# Patient Record
Sex: Male | Born: 1995 | Race: White | Hispanic: No | Marital: Single | Smoking: Never smoker
Health system: Southern US, Community
[De-identification: ages and names within clinical notes are randomized; demographics above are authoritative.]

## PROBLEM LIST (undated history)

## (undated) HISTORY — PX: MOUTH SURGERY: SHX715

---

## 2015-12-30 ENCOUNTER — Encounter: Payer: Self-pay | Admitting: Emergency Medicine

## 2015-12-30 ENCOUNTER — Emergency Department: Payer: BLUE CROSS/BLUE SHIELD

## 2015-12-30 ENCOUNTER — Emergency Department
Admission: EM | Admit: 2015-12-30 | Discharge: 2015-12-30 | Disposition: A | Discharge status: Home / Self Care | Payer: BLUE CROSS/BLUE SHIELD | Attending: Emergency Medicine | Admitting: Emergency Medicine

## 2015-12-30 DIAGNOSIS — Y929 Unspecified place or not applicable: Secondary | ICD-10-CM | POA: Insufficient documentation

## 2015-12-30 DIAGNOSIS — X500XXA Overexertion from strenuous movement or load, initial encounter: Secondary | ICD-10-CM | POA: Diagnosis not present

## 2015-12-30 DIAGNOSIS — S86811A Strain of other muscle(s) and tendon(s) at lower leg level, right leg, initial encounter: Secondary | ICD-10-CM | POA: Diagnosis not present

## 2015-12-30 DIAGNOSIS — Y998 Other external cause status: Secondary | ICD-10-CM | POA: Insufficient documentation

## 2015-12-30 DIAGNOSIS — Y9365 Activity, lacrosse and field hockey: Secondary | ICD-10-CM | POA: Diagnosis not present

## 2015-12-30 DIAGNOSIS — M25561 Pain in right knee: Secondary | ICD-10-CM

## 2015-12-30 DIAGNOSIS — S86911A Strain of unspecified muscle(s) and tendon(s) at lower leg level, right leg, initial encounter: Secondary | ICD-10-CM

## 2015-12-30 DIAGNOSIS — S8991XA Unspecified injury of right lower leg, initial encounter: Secondary | ICD-10-CM | POA: Diagnosis present

## 2015-12-30 MED ORDER — KETOROLAC TROMETHAMINE 60 MG/2ML IM SOLN
60.0000 mg | Freq: Once | INTRAMUSCULAR | Status: AC
  Administered 2015-12-30: 60 mg via INTRAMUSCULAR
  Filled 2015-12-30: qty 2

## 2015-12-30 MED ORDER — TRAMADOL HCL 50 MG PO TABS: 50.0000 mg | ORAL_TABLET | Freq: Four times a day (QID) | ORAL | 0 refills | Status: DC | PRN

## 2015-12-30 MED ORDER — TRAMADOL HCL 50 MG PO TABS
50.0000 mg | ORAL_TABLET | Freq: Once | ORAL | Status: AC
  Administered 2015-12-30: 50 mg via ORAL
  Filled 2015-12-30: qty 1

## 2015-12-30 NOTE — ED Provider Notes (Signed)
[facility] Emergency Department Provider Note   ____________________________________________   First MD Initiated Contact with Patient 12/30/15 [phone]     (approximate)  I have reviewed the triage vital signs and the nursing notes.   HISTORY  Chief Complaint Knee Injury    HPI Luther HearingBrendan P M Weber is a 20 y.o. male who comes into the hospital today with some right-sided medial knee pain. The patient was playing Lacrosse this evening and fell. He reports that his knee was pushed up. He felt as though his patella was dislocated and when he tried to reduce the dislocation someone pushed him again and his knee extended. He reports that he felt a pop in his medial knee and developed pain. The patient is unable to walk on it. He tried to go home but it was very painful. At rest he doesn't have any pain but if he tries to move or straighten his leg is very painful. His knee is most comfortable when he flexed. He did not take any medication at home for the pain but reports that he did ice it. He has had some dislocations in the past. He is here for evaluation   History reviewed. No pertinent past medical history.  There are no active problems to display for this patient.   Past Surgical History:  Procedure Laterality Date  . MOUTH SURGERY      Prior to Admission medications   Medication Sig Start Date End Date Taking? Authorizing Provider  traMADol (ULTRAM) 50 MG tablet Take 1 tablet (50 mg total) by mouth every 6 (six) hours as needed. 12/30/15   Rebecka ApleyAllison P Aliscia Clayton, MD    Allergies Patient has no known allergies.  No family history on file.  Social History Social History  Substance Use Topics  . Smoking status: Never Smoker  . Smokeless tobacco: Never Used  . Alcohol use Yes    Review of Systems Constitutional: No fever/chills Eyes: No visual changes. ENT: No sore throat. Cardiovascular: Denies chest pain. Respiratory: Denies shortness of  breath. Gastrointestinal: No abdominal pain.  No nausea, no vomiting.  No diarrhea.  No constipation. Genitourinary: Negative for dysuria. Musculoskeletal: Right knee pain Skin: Negative for rash. Neurological: Negative for headaches, focal weakness or numbness.  10-point ROS otherwise negative.  ____________________________________________   PHYSICAL EXAM:  VITAL SIGNS: ED Triage Vitals  Enc Vitals Group     BP 12/30/15 0059 135/78     Pulse Rate 12/30/15 0059 69     Resp 12/30/15 0059 18     Temp 12/30/15 0059 98.2 F (36.8 C)     Temp Source 12/30/15 0059 Oral     SpO2 12/30/15 0059 97 %     Weight 12/30/15 0100 160 lb (72.6 kg)     Height 12/30/15 0100 6' (1.829 m)     Head Circumference --      Peak Flow --      Pain Score 12/30/15 0100 6     Pain Loc --      Pain Edu? --      Excl. in GC? --     Constitutional: Alert and oriented. Well appearing and in mild distress. Eyes: Conjunctivae are normal. PERRL. EOMI. Head: Atraumatic. Nose: No congestion/rhinnorhea. Mouth/Throat: Mucous membranes are moist.  Oropharynx non-erythematous. Cardiovascular: Normal rate, regular rhythm. Grossly normal heart sounds.  Good peripheral circulation. Respiratory: Normal respiratory effort.  No retractions. Lungs CTAB. Gastrointestinal: Soft and nontender. No distention. Positive bowel sounds Musculoskeletal: Right knee pain  to palpation medially, no effusion. Pain to varus stress, pain with extension of leg, negative anterior and posterior drawer sign.  Neurologic:  Normal speech and language.  Skin:  Skin is warm, dry and intact.  Psychiatric: Mood and affect are normal.   ____________________________________________   LABS (all labs ordered are listed, but only abnormal results are displayed)  Labs Reviewed - No data to display ____________________________________________  EKG  none ____________________________________________  RADIOLOGY  Right knee  xray ____________________________________________   PROCEDURES  Procedure(s) performed: None  Procedures  Critical Care performed: No  ____________________________________________   INITIAL IMPRESSION / ASSESSMENT AND PLAN / ED COURSE  Pertinent labs & imaging results that were available during my care of the patient were reviewed by me and considered in my medical decision making (see chart for details).  This is a 20 year old male who comes into the hospital today with some knee pain after an injury while playing Lacrosse. I did attempt to straighten the patient's leg and it is uncomfortable. I'll give the patient a shot of Toradol and a dose of tramadol for his pain. I will send the patient for an x-ray and place him in a knee immobilizer. The patient will also receive some crutches.  Clinical Course as of Dec 30 446  Tue Dec 30, 2015  0300 Negative DG Knee Complete 4 Views Right [AW]    Clinical Course User Index [AW] Rebecka ApleyAllison P Sanaa Zilberman, MD   The patient's x-ray is negative. The patient needs to follow-up with orthopedic surgery for further evaluation of his knee pain. He will receive some medication for home and he'll be discharged to follow-up.  ____________________________________________   FINAL CLINICAL IMPRESSION(S) / ED DIAGNOSES  Final diagnoses:  Acute pain of right knee  Strain of right knee, initial encounter      NEW MEDICATIONS STARTED DURING THIS VISIT:  New Prescriptions   TRAMADOL (ULTRAM) 50 MG TABLET    Take 1 tablet (50 mg total) by mouth every 6 (six) hours as needed.     Note:  This document was prepared using Dragon voice recognition software and may include unintentional dictation errors.    Rebecka ApleyAllison P Casara Perrier, MD 12/30/15 [phone]

## 2015-12-30 NOTE — ED Triage Notes (Signed)
Pt presents to ED with c/o right knee injury , pt reports feeling right knee a tear and hearing a pop after playing lacrosse last night around 9 pm. No swelling or obvious deformity noted.

## 2018-04-23 ENCOUNTER — Encounter (HOSPITAL_COMMUNITY): Payer: Self-pay | Admitting: Emergency Medicine

## 2018-04-23 ENCOUNTER — Emergency Department (HOSPITAL_COMMUNITY): Payer: Self-pay

## 2018-04-23 ENCOUNTER — Emergency Department (HOSPITAL_COMMUNITY)
Admission: EM | Admit: 2018-04-23 | Discharge: 2018-04-23 | Disposition: A | Discharge status: Home / Self Care | Payer: Self-pay | Attending: Emergency Medicine | Admitting: Emergency Medicine

## 2018-04-23 ENCOUNTER — Other Ambulatory Visit: Payer: Self-pay

## 2018-04-23 DIAGNOSIS — Y929 Unspecified place or not applicable: Secondary | ICD-10-CM | POA: Insufficient documentation

## 2018-04-23 DIAGNOSIS — X500XXA Overexertion from strenuous movement or load, initial encounter: Secondary | ICD-10-CM | POA: Insufficient documentation

## 2018-04-23 DIAGNOSIS — S82455A Nondisplaced comminuted fracture of shaft of left fibula, initial encounter for closed fracture: Secondary | ICD-10-CM

## 2018-04-23 DIAGNOSIS — Y9389 Activity, other specified: Secondary | ICD-10-CM | POA: Insufficient documentation

## 2018-04-23 DIAGNOSIS — Y999 Unspecified external cause status: Secondary | ICD-10-CM | POA: Insufficient documentation

## 2018-04-23 MED ORDER — HYDROCODONE-ACETAMINOPHEN 5-325 MG PO TABS: 1.0000 | ORAL_TABLET | ORAL | 0 refills | Status: AC | PRN

## 2018-04-23 NOTE — Progress Notes (Signed)
Orthopedic Tech Progress Note Patient Details:  Darren Weber 21-Jan-1996 [id number] Wanted to give crutch training but he said he had crutches before. Ortho Devices Type of Ortho Device: Crutches, Short leg splint Ortho Device/Splint Location: LLE Ortho Device/Splint Interventions: Application, Adjustment, Ordered   Post Interventions Patient Tolerated: Well Instructions Provided: Care of device, Adjustment of device   Darren Weber 04/23/2018, 3:33 PM

## 2018-04-23 NOTE — ED Provider Notes (Signed)
[facility] EMERGENCY DEPARTMENT Provider Note   CSN: [id number] Arrival date & time: 04/23/18  1409    History   Chief Complaint Chief Complaint  Patient presents with  . Ankle Injury    HPI Darren Weber is a 23 y.o. male.     23 year old male presents with complaint of left ankle injury.  Patient states that he was doing a back flip last night when his ankle gave out and rolled.  She reports pain and swelling to diffuse left ankle.  Did not hit head, no loss of consciousness, no other injuries or concerns.  Reports prior sprain and fracture to this ankle several years ago, did not require surgical repair. Also ecchymosis to left great toe from injury skiing a few weeks ago.     History reviewed. No pertinent past medical history.  There are no active problems to display for this patient.   Past Surgical History:  Procedure Laterality Date  . MOUTH SURGERY          Home Medications    Prior to Admission medications   Medication Sig Start Date End Date Taking? Authorizing Provider  HYDROcodone-acetaminophen (NORCO/VICODIN) 5-325 MG tablet Take 1 tablet by mouth every 4 (four) hours as needed. 04/23/18   Jeannie Fend, PA-C    Family History No family history on file.  Social History Social History   Tobacco Use  . Smoking status: Never Smoker  . Smokeless tobacco: Never Used  Substance Use Topics  . Alcohol use: Yes  . Drug use: Not on file     Allergies   Patient has no known allergies.   Review of Systems Review of Systems  Constitutional: Negative for fever.  Musculoskeletal: Positive for arthralgias, joint swelling and myalgias.  Skin: Negative for color change, rash and wound.  Allergic/Immunologic: Negative for immunocompromised state.  Neurological: Negative for weakness and numbness.  Hematological: Does not bruise/bleed easily.  Psychiatric/Behavioral: Negative for self-injury.  All other systems reviewed and are  negative.    Physical Exam Updated Vital Signs BP (!) 154/74 (BP Location: Left Arm)   Pulse 98   Temp 98.2 F (36.8 C) (Oral)   Resp 20   SpO2 97%   Physical Exam Vitals signs and nursing note reviewed.  Constitutional:      General: He is not in acute distress.    Appearance: He is well-developed. He is not diaphoretic.  HENT:     Head: Normocephalic and atraumatic.  Cardiovascular:     Pulses: Normal pulses.  Pulmonary:     Effort: Pulmonary effort is normal.  Musculoskeletal:        General: Swelling, tenderness and signs of injury present.     Left ankle: He exhibits decreased range of motion, swelling and ecchymosis. He exhibits no deformity, no laceration and normal pulse. Tenderness. Lateral malleolus and medial malleolus tenderness found. No head of 5th metatarsal and no proximal fibula tenderness found.  Skin:    General: Skin is warm and dry.     Capillary Refill: Capillary refill takes less than 2 seconds.     Findings: No erythema or rash.  Neurological:     Mental Status: He is alert and oriented to person, place, and time.  Psychiatric:        Behavior: Behavior normal.      ED Treatments / Results  Labs (all labs ordered are listed, but only abnormal results are displayed) Labs Reviewed - No data to display  EKG None  Radiology Dg Ankle Complete Left  Result Date: 04/23/2018 CLINICAL DATA:  Left ankle pain following fall, initial encounter EXAM: LEFT ANKLE COMPLETE - 3+ VIEW COMPARISON:  None. FINDINGS: Comminuted fracture of the distal fibular diaphysis is noted extending into the metaphysis. Mild widening of the medial tibiotalar articulation is noted. No tibial fracture is noted. Soft tissue swelling is seen. IMPRESSION: Comminuted distal fibular fracture with soft tissue abnormality. Electronically Signed   By: Alcide Clever M.D.   On: 04/23/2018 14:57   Dg Foot Complete Left  Result Date: 04/23/2018 CLINICAL DATA:  Left foot pain following fall,  initial encounter EXAM: LEFT FOOT - COMPLETE 3+ VIEW COMPARISON:  None. FINDINGS: Comminuted distal fibular fracture is again identified. No other fracture or dislocation is seen. Soft tissue swelling about the ankle is noted. IMPRESSION: Comminuted distal fibular fracture. No other focal abnormality is seen. Electronically Signed   By: Alcide Clever M.D.   On: 04/23/2018 14:58    Procedures Procedures (including critical care time)  Medications Ordered in ED Medications - No data to display   Initial Impression / Assessment and Plan / ED Course  I have reviewed the triage vital signs and the nursing notes.  Pertinent labs & imaging results that were available during my care of the patient were reviewed by me and considered in my medical decision making (see chart for details).  Clinical Course as of Apr 23 1522  Sun Apr 23, 2018  2824 23 year old male presents with left ankle injury from last night.  Sam patient is neurovascular intact with strong dorsalis pedis pulse.  Patient has swelling with ecchymosis and pain to his diffuse left ankle.  X-ray shows fracture of the left fibula.  Case discussed with Dr. Constance Goltz, on-call with orthopedics who recommends posterior splint, nonweightbearing and call the office in the morning for an appointment.  Patient was given prescription for Norco to take for pain, advised to leave the splint on, elevate and ice.  Patient understands he is nonweightbearing and he will call the office in the morning to schedule follow-up.   [LM]    Clinical Course User Index [LM] Jeannie Fend, PA-C   Final Clinical Impressions(s) / ED Diagnoses   Final diagnoses:  Closed nondisplaced comminuted fracture of shaft of left fibula, initial encounter    ED Discharge Orders         Ordered    HYDROcodone-acetaminophen (NORCO/VICODIN) 5-325 MG tablet  Every 4 hours PRN     04/23/18 1520           Alden Hipp 04/23/18 1524    Geoffery Lyons, MD 04/23/18  1742

## 2018-04-23 NOTE — Discharge Instructions (Signed)
Non weight bearing. Take Norco as needed as prescribed for pain. You can also take 800mg  Ibuprofen every 6 hours. Elevate leg above the level of your heart to help reduce swelling and pain. Apply ice for 30 minutes at a time. Leave your splint on, keep it clean and dry.  Call ortho in the morning for an appointment.

## 2018-04-23 NOTE — ED Triage Notes (Signed)
Did back flip last night and injured left ankle,  Can wiggle toes, area swollen and tender to touch

## 2018-04-23 NOTE — ED Notes (Signed)
Discharge instructions and prescription discussed with Pt. Pt verbalized understanding. Pt stable and ambulatory.    

## 2018-04-23 NOTE — ED Notes (Signed)
Ice pack applied to left ankle.

## 2019-11-28 IMAGING — CR DG ANKLE COMPLETE 3+V*L*
3 series · 3 of 3 positions shown · non-contrast
Comparison: None.

CLINICAL DATA: Left ankle pain following fall, initial encounter

EXAM:
LEFT ANKLE COMPLETE - 3+ VIEW

[ankle ap]
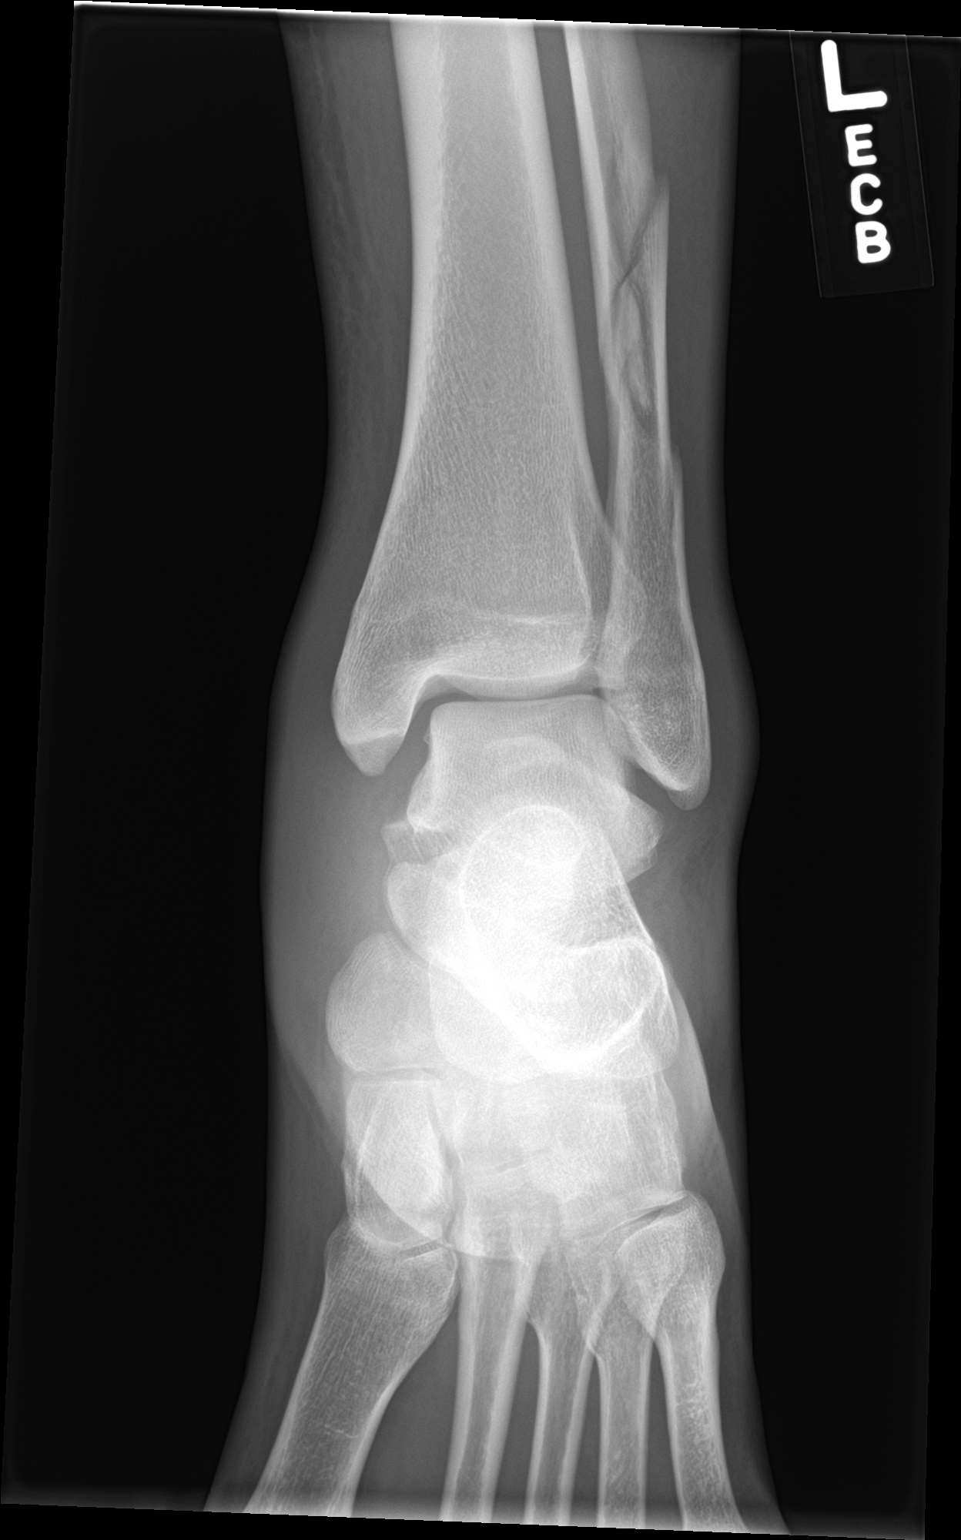

[ankle obl]
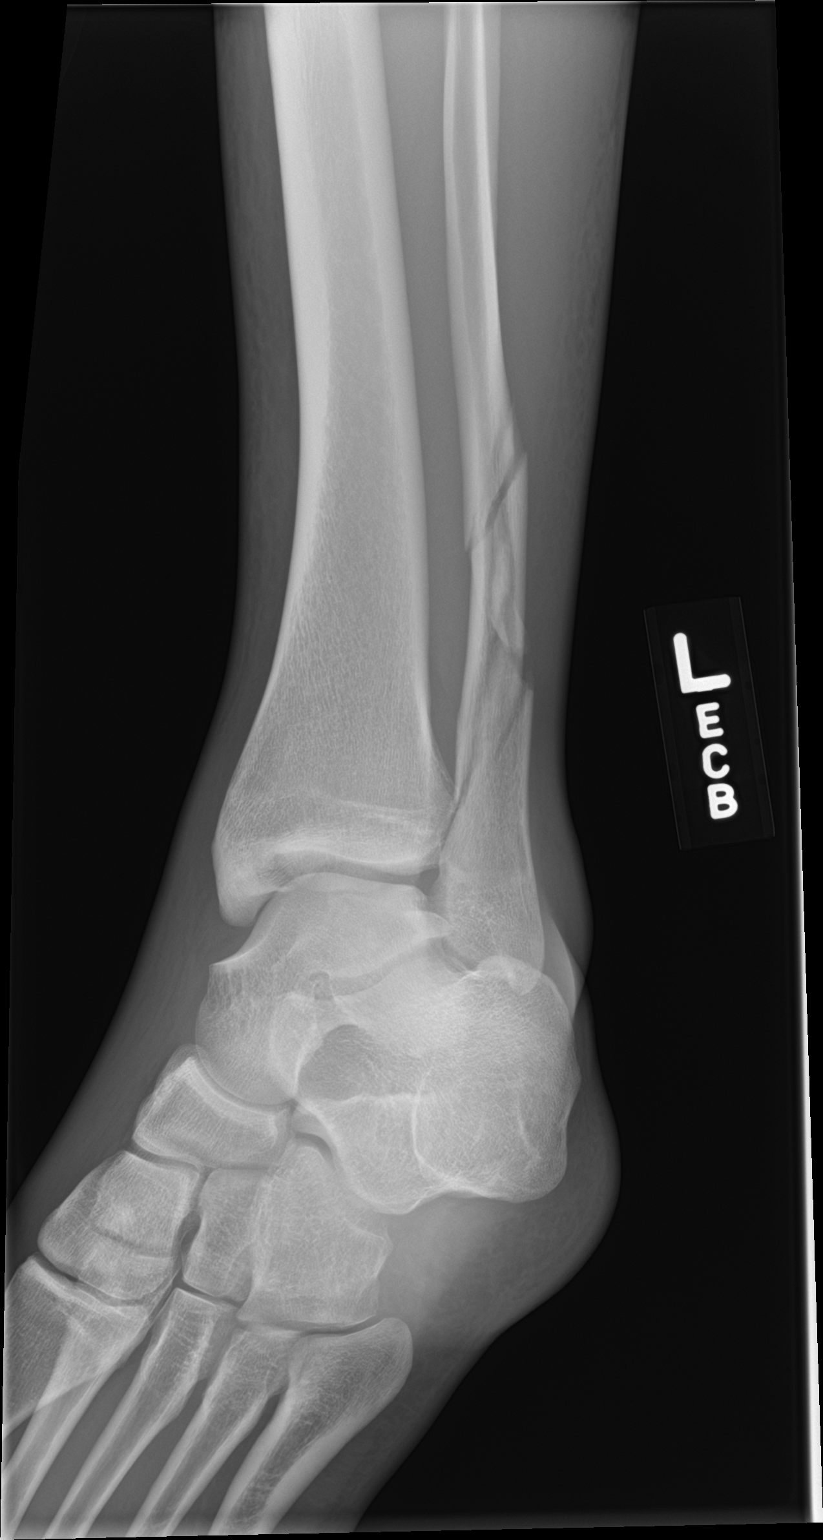

[ankle lat]
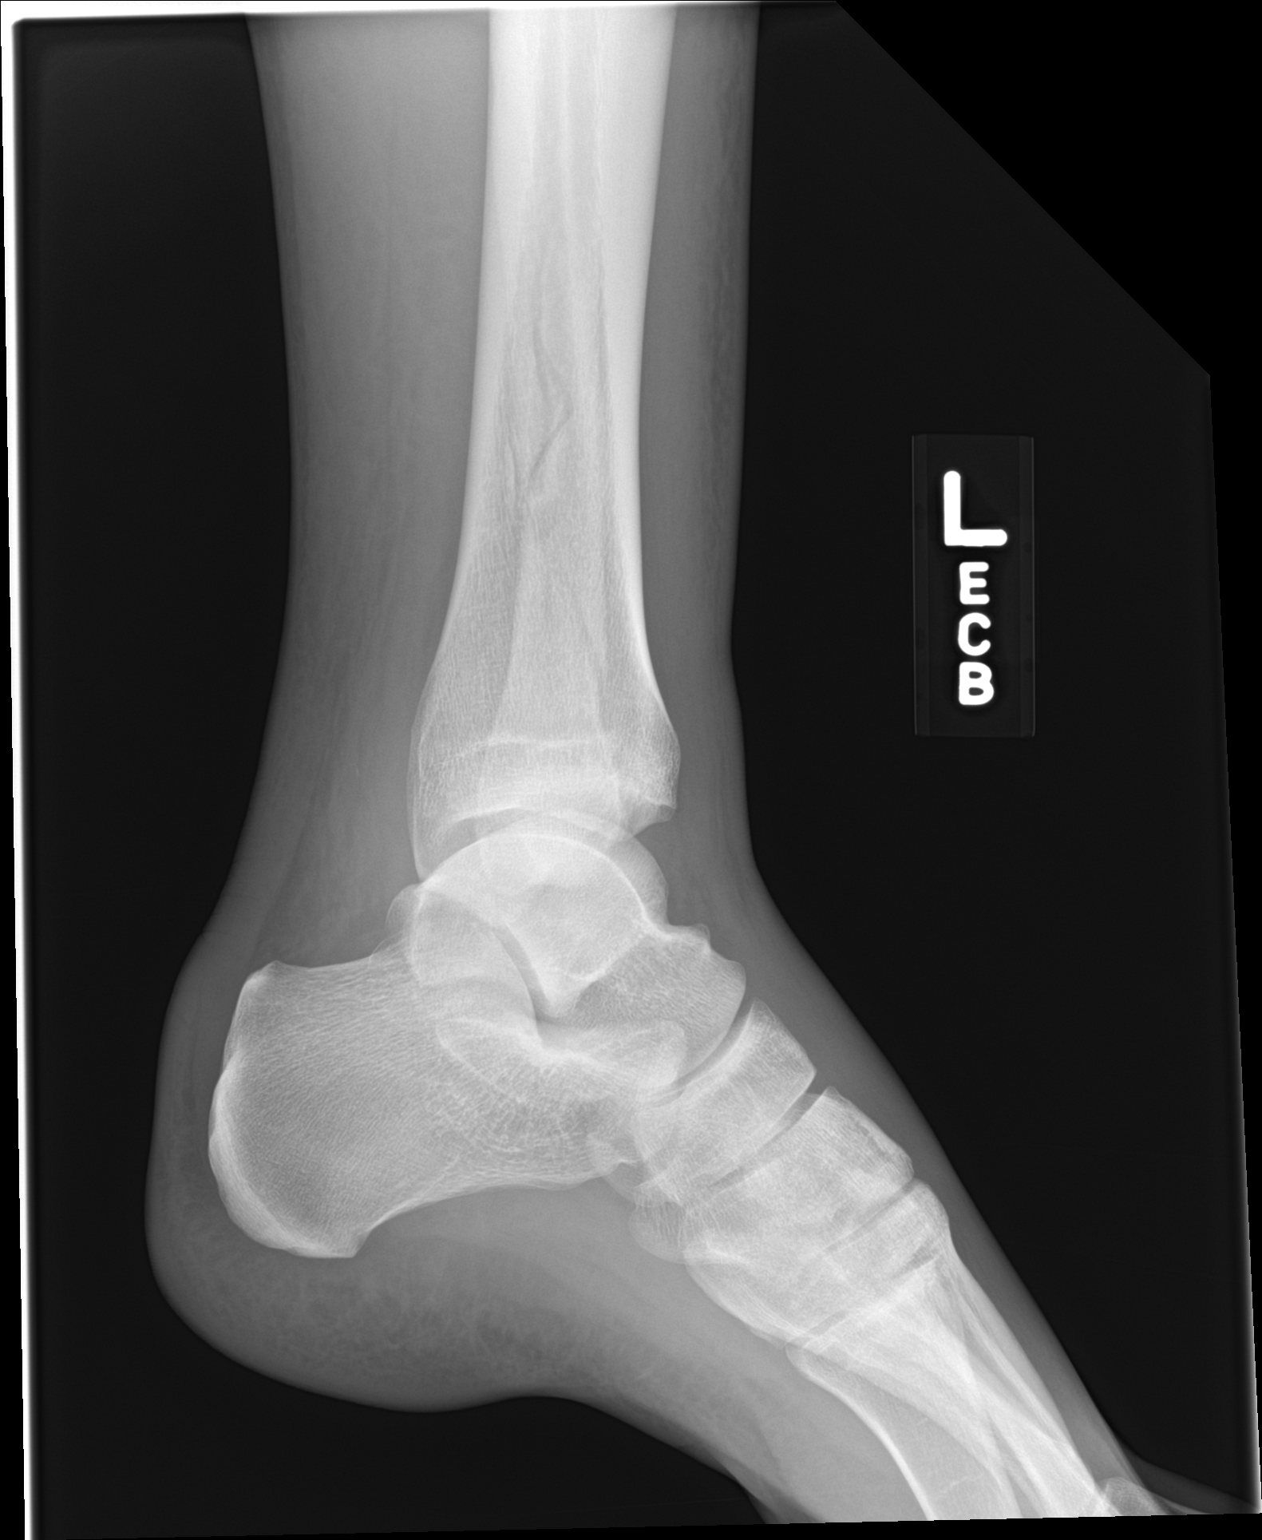

[3 of 3 positions shown; findings below may reference images not displayed]

FINDINGS: Comminuted fracture of the distal fibular diaphysis is noted
extending into the metaphysis. Mild widening of the medial
tibiotalar articulation is noted. No tibial fracture is noted. Soft
tissue swelling is seen.
IMPRESSION: Comminuted distal fibular fracture with soft tissue abnormality.
